# Patient Record
Sex: Female | Born: 1985 | Hispanic: Yes | Marital: Married | Smoking: Never smoker
Health system: Southern US, Community
[De-identification: ages and names within clinical notes are randomized; demographics above are authoritative.]

---

## 2011-07-09 ENCOUNTER — Encounter: Payer: Self-pay | Admitting: Physician Assistant

## 2011-08-05 ENCOUNTER — Ambulatory Visit (INDEPENDENT_AMBULATORY_CARE_PROVIDER_SITE_OTHER): Payer: Managed Care, Other (non HMO)

## 2011-08-05 DIAGNOSIS — Z23 Encounter for immunization: Secondary | ICD-10-CM

## 2011-08-05 DIAGNOSIS — Z3201 Encounter for pregnancy test, result positive: Secondary | ICD-10-CM

## 2011-08-18 ENCOUNTER — Ambulatory Visit (INDEPENDENT_AMBULATORY_CARE_PROVIDER_SITE_OTHER): Payer: Managed Care, Other (non HMO)

## 2011-08-18 ENCOUNTER — Other Ambulatory Visit: Payer: Self-pay | Admitting: Family Medicine

## 2011-08-18 DIAGNOSIS — Z3201 Encounter for pregnancy test, result positive: Secondary | ICD-10-CM

## 2011-08-18 DIAGNOSIS — N390 Urinary tract infection, site not specified: Secondary | ICD-10-CM

## 2011-08-21 LAB — URINE CULTURE: Colony Count: >=100000

## 2011-09-02 LAB — OB RESULTS CONSOLE RUBELLA ANTIBODY, IGM: Rubella: IMMUNE

## 2011-09-02 LAB — OB RESULTS CONSOLE RPR: RPR: NONREACTIVE

## 2011-09-02 LAB — OB RESULTS CONSOLE HIV ANTIBODY (ROUTINE TESTING): HIV: NONREACTIVE

## 2011-09-02 LAB — OB RESULTS CONSOLE GC/CHLAMYDIA: Gonorrhea: NEGATIVE

## 2011-09-02 LAB — OB RESULTS CONSOLE ABO/RH: RH Type: POSITIVE

## 2011-10-01 ENCOUNTER — Ambulatory Visit (INDEPENDENT_AMBULATORY_CARE_PROVIDER_SITE_OTHER): Payer: Managed Care, Other (non HMO) | Admitting: Physician Assistant

## 2011-10-01 ENCOUNTER — Encounter: Payer: Self-pay | Admitting: Physician Assistant

## 2011-10-01 VITALS — BP 103/71 | HR 102 | Temp 99.2°F | Resp 16 | Ht 65.0 in | Wt 127.8 lb

## 2011-10-01 DIAGNOSIS — Z0000 Encounter for general adult medical examination without abnormal findings: Secondary | ICD-10-CM

## 2011-10-01 DIAGNOSIS — R112 Nausea with vomiting, unspecified: Secondary | ICD-10-CM

## 2011-10-01 MED ORDER — ONDANSETRON 4 MG PO TBDP: 4.0000 mg | ORAL_TABLET | Freq: Three times a day (TID) | ORAL | Status: DC | PRN

## 2011-10-01 NOTE — Progress Notes (Signed)
  Subjective:    Patient ID: Julia David, female    DOB: October 19, 1985, 26 y.o.   MRN: [id number]  HPI Presents for Wellness exam.  PMH: Fibrocystic breasts, PCOS.  Pregnant, 13 weeks.  Experiencing significant N/V, early satiety, bloated, constipation, fatigue.  Zofran without benefit.  Seeing Dr. Renaldo Fiddler at [facility], next appointment 10/08/2011.   Review of Systems As above, otherwise negative.    Objective:   Physical Exam  Vital signs noted. Well-developed, well nourished [place] female, accompanied by her husband, who is awake, alert and oriented, in NAD. HEENT: [place]/AT, PERRL, EOMI.  Sclera and conjunctiva are clear.  EAC are patent, TMs are normal in appearance. Nasal mucosa is pink and moist. OP is clear. Neck: supple, non-tender, no lymphadenopathey, thyromegaly. Heart: RRR, no murmur Lungs: CTA Abdomen: normo-active bowel sounds, supple, non-tender, no mass or organomegaly. Extremities: no cyanosis, clubbing or edema. Skin: warm and dry without rash.  Labs deferred to OB.  Patient was unable to provide a urine specimen.     Assessment & Plan:  Wellness Exam. Age appropriate health guidance provided.  N/V secondary to pregnancy. Increase Zofran to 8 mg.  Increase PO fluids with popcicles, crushed ice, anything.  If still no improvement, contact OB for additional instructions.

## 2011-10-01 NOTE — Patient Instructions (Signed)
Please get as much liquid as you can.  Popsicles are a great way to get fluids when you have nausea.  Increase the Zofran (ondansetron) to 2 tablets every 8 hours as needed.  If the increase in dose doesn't help, contact your OB, as you may need alternative treatment. You may use over-the-counter simethicone for gas.  Be sure to take a stool softener, too, as both pregnancy and Zofran can cause constipation, which can make you even more uncomfortable.

## 2012-03-03 LAB — OB RESULTS CONSOLE GBS: GBS: NEGATIVE

## 2012-03-24 ENCOUNTER — Encounter (HOSPITAL_COMMUNITY): Payer: Self-pay | Admitting: *Deleted

## 2012-03-24 ENCOUNTER — Inpatient Hospital Stay (HOSPITAL_COMMUNITY)
Admission: AD | Admit: 2012-03-24 | Discharge: 2012-03-27 | DRG: 775 | Disposition: A | Discharge status: Home / Self Care | Payer: Managed Care, Other (non HMO) | Source: Ambulatory Visit | Attending: Obstetrics and Gynecology | Admitting: Obstetrics and Gynecology

## 2012-03-24 DIAGNOSIS — O878 Other venous complications in the puerperium: Secondary | ICD-10-CM | POA: Diagnosis present

## 2012-03-24 DIAGNOSIS — K649 Unspecified hemorrhoids: Secondary | ICD-10-CM | POA: Diagnosis present

## 2012-03-24 LAB — CBC
Hemoglobin: 13.1 g/dL (ref 12.0–15.0)
MCH: 31.4 pg (ref 26.0–34.0)
MCHC: 32.9 g/dL (ref 30.0–36.0)
MCV: 95.4 fL (ref 78.0–100.0)
Platelets: 231 10*3/uL (ref 150–400)
RBC: 4.17 MIL/uL (ref 3.87–5.11)

## 2012-03-24 LAB — ABO/RH: ABO/RH(D): O POS

## 2012-03-24 MED ORDER — ACETAMINOPHEN 325 MG PO TABS: 650.0000 mg | ORAL_TABLET | ORAL | Status: DC | PRN

## 2012-03-24 MED ORDER — LACTATED RINGERS IV SOLN: 500.0000 mL | INTRAVENOUS | Status: DC | PRN

## 2012-03-24 MED ORDER — SODIUM CHLORIDE 0.9 % IV SOLN
2.0000 g | Freq: Once | INTRAVENOUS | Status: DC
  Filled 2012-03-24: qty 2000

## 2012-03-24 MED ORDER — CITRIC ACID-SODIUM CITRATE 334-500 MG/5ML PO SOLN: 30.0000 mL | ORAL | Status: DC | PRN

## 2012-03-24 MED ORDER — OXYTOCIN 40 UNITS IN LACTATED RINGERS INFUSION - SIMPLE MED
62.5000 mL/h | Freq: Once | INTRAVENOUS | Status: AC
  Administered 2012-03-25: 62.5 mL/h via INTRAVENOUS
  Filled 2012-03-24: qty 1000

## 2012-03-24 MED ORDER — OXYTOCIN 40 UNITS IN LACTATED RINGERS INFUSION - SIMPLE MED
1.0000 m[IU]/min | INTRAVENOUS | Status: DC
  Administered 2012-03-24: 2 m[IU]/min via INTRAVENOUS

## 2012-03-24 MED ORDER — IBUPROFEN 600 MG PO TABS: 600.0000 mg | ORAL_TABLET | Freq: Four times a day (QID) | ORAL | Status: DC | PRN

## 2012-03-24 MED ORDER — LIDOCAINE HCL (PF) 1 % IJ SOLN
30.0000 mL | INTRAMUSCULAR | Status: DC | PRN
  Administered 2012-03-25: 30 mL via SUBCUTANEOUS
  Filled 2012-03-24: qty 30

## 2012-03-24 MED ORDER — BUTORPHANOL TARTRATE 1 MG/ML IJ SOLN: 1.0000 mg | INTRAMUSCULAR | Status: DC | PRN

## 2012-03-24 MED ORDER — OXYCODONE-ACETAMINOPHEN 5-325 MG PO TABS: 1.0000 | ORAL_TABLET | ORAL | Status: DC | PRN

## 2012-03-24 MED ORDER — OXYTOCIN BOLUS FROM INFUSION
250.0000 mL | Freq: Once | INTRAVENOUS | Status: AC
  Administered 2012-03-25: 250 mL via INTRAVENOUS
  Filled 2012-03-24: qty 500

## 2012-03-24 MED ORDER — FLEET ENEMA 7-19 GM/118ML RE ENEM: 1.0000 | ENEMA | RECTAL | Status: DC | PRN

## 2012-03-24 MED ORDER — TERBUTALINE SULFATE 1 MG/ML IJ SOLN: 0.2500 mg | Freq: Once | INTRAMUSCULAR | Status: AC | PRN

## 2012-03-24 MED ORDER — ONDANSETRON HCL 4 MG/2ML IJ SOLN: 4.0000 mg | Freq: Four times a day (QID) | INTRAMUSCULAR | Status: DC | PRN

## 2012-03-24 MED ORDER — OXYTOCIN 40 UNITS IN LACTATED RINGERS INFUSION - SIMPLE MED: 1.0000 m[IU]/min | INTRAVENOUS | Status: DC

## 2012-03-24 MED ORDER — CLINDAMYCIN PHOSPHATE 900 MG/50ML IV SOLN: 900.0000 mg | Freq: Once | INTRAVENOUS | Status: DC

## 2012-03-24 MED ORDER — LACTATED RINGERS IV SOLN
INTRAVENOUS | Status: DC
  Administered 2012-03-24 – 2012-03-25 (×2): via INTRAVENOUS

## 2012-03-24 NOTE — Plan of Care (Signed)
Problem: Consults Goal: Birthing Suites Patient Information Press F2 to bring up selections list   Pt 37-[redacted] weeks EGA     

## 2012-03-24 NOTE — Progress Notes (Signed)
Cx 5/C/-2 FHT reactive UCs about q 3-5 min

## 2012-03-24 NOTE — Progress Notes (Signed)
Cx 4/C/-2/vtx AROM scant clear fluid    Prob 3 cm cyst at 3:00 position on cx FHT reactive UCs irreg D/W patient/husband will begin pitocin, epidural prn

## 2012-03-24 NOTE — H&P (Signed)
Julia David is a 26 y.o. female presenting for labor.  Patient presented to office today with c/o decreased FM.  At office FM became normal and NST reactive.  UCs about q 3-4 min and cervix changed from 3/50/-2 to 4/90/-2 and vertex. NO ROM/Bleeding, no CNS change or epigastric pain. Maternal Medical History:  Reason for admission: Reason for admission: contractions.  Contractions: Onset was 3-5 hours ago.    Fetal activity: Perceived fetal activity is normal.      OB History    Grav Para Term Preterm Abortions TAB SAB Ect Mult Living   1              No past medical history on file. No past surgical history on file. Family History: family history is not on file. Social History:  reports that she has never smoked. She does not have any smokeless tobacco history on file. Her alcohol and drug histories not on file.   Prenatal Transfer Tool  Maternal Diabetes: No Genetic Screening: Normal Maternal Ultrasounds/Referrals: Normal Fetal Ultrasounds or other Referrals:  None Maternal Substance Abuse:  No Significant Maternal Medications:  None Significant Maternal Lab Results:  None Other Comments:  None  Review of Systems  Eyes: Negative for blurred vision.  Gastrointestinal: Negative for abdominal pain.  Neurological: Negative for headaches.      Blood pressure 120/77, pulse 101, temperature 98.5 F (36.9 C), temperature source Oral, height 5\' 3"  (1.6 m), weight 71.215 kg (157 lb), last menstrual period 06/29/2011. Maternal Exam:  Uterine Assessment: Contraction strength is moderate.  Contraction frequency is regular.   Abdomen: Patient reports no abdominal tenderness. Fetal presentation: vertex     Fetal Exam Fetal Monitor Review: Pattern: accelerations present.       Physical Exam  Cardiovascular: Normal rate and regular rhythm.   Respiratory: Effort normal and breath sounds normal.  GI: Soft. Bowel sounds are normal.  Neurological: She has normal reflexes.    Prenatal labs: ABO, Rh:   Antibody:   Rubella:   RPR:    HBsAg:    HIV:    GBS:     Assessment/Plan: 26 yo G1P0 at 57 4/7 weeks entering active labor. Admit   Julia David II,Julia David E 03/24/2012, 4:21 PM

## 2012-03-24 NOTE — Progress Notes (Signed)
Cx 4-5/C/-2 Cyst on cx unchanged FHT reactive UCs about q 3-4 min.

## 2012-03-25 ENCOUNTER — Inpatient Hospital Stay (HOSPITAL_COMMUNITY): Payer: Managed Care, Other (non HMO) | Admitting: Anesthesiology

## 2012-03-25 ENCOUNTER — Encounter (HOSPITAL_COMMUNITY): Payer: Self-pay | Admitting: Anesthesiology

## 2012-03-25 ENCOUNTER — Encounter (HOSPITAL_COMMUNITY): Payer: Self-pay | Admitting: *Deleted

## 2012-03-25 MED ORDER — TETANUS-DIPHTH-ACELL PERTUSSIS 5-2.5-18.5 LF-MCG/0.5 IM SUSP
0.5000 mL | Freq: Once | INTRAMUSCULAR | Status: DC
  Filled 2012-03-25: qty 0.5

## 2012-03-25 MED ORDER — ZOLPIDEM TARTRATE 5 MG PO TABS: 5.0000 mg | ORAL_TABLET | Freq: Every evening | ORAL | Status: DC | PRN

## 2012-03-25 MED ORDER — FENTANYL 2.5 MCG/ML BUPIVACAINE 1/10 % EPIDURAL INFUSION (WH - ANES)
14.0000 mL/h | INTRAMUSCULAR | Status: DC
Start: 2012-03-25 — End: 2012-03-25
  Filled 2012-03-25: qty 60

## 2012-03-25 MED ORDER — SENNOSIDES-DOCUSATE SODIUM 8.6-50 MG PO TABS: 2.0000 | ORAL_TABLET | Freq: Every day | ORAL | Status: DC

## 2012-03-25 MED ORDER — FLEET ENEMA 7-19 GM/118ML RE ENEM: 1.0000 | ENEMA | Freq: Every day | RECTAL | Status: DC | PRN

## 2012-03-25 MED ORDER — WITCH HAZEL-GLYCERIN EX PADS
1.0000 "application " | MEDICATED_PAD | CUTANEOUS | Status: DC | PRN
  Administered 2012-03-26 – 2012-03-27 (×2): 1 via TOPICAL

## 2012-03-25 MED ORDER — ONDANSETRON HCL 4 MG/2ML IJ SOLN: 4.0000 mg | INTRAMUSCULAR | Status: DC | PRN

## 2012-03-25 MED ORDER — EPHEDRINE 5 MG/ML INJ
10.0000 mg | INTRAVENOUS | Status: DC | PRN
  Filled 2012-03-25: qty 4

## 2012-03-25 MED ORDER — LACTATED RINGERS IV SOLN
500.0000 mL | Freq: Once | INTRAVENOUS | Status: AC
  Administered 2012-03-25: 500 mL via INTRAVENOUS

## 2012-03-25 MED ORDER — EPHEDRINE 5 MG/ML INJ: 10.0000 mg | INTRAVENOUS | Status: DC | PRN

## 2012-03-25 MED ORDER — DIPHENHYDRAMINE HCL 25 MG PO CAPS: 25.0000 mg | ORAL_CAPSULE | Freq: Four times a day (QID) | ORAL | Status: DC | PRN

## 2012-03-25 MED ORDER — ONDANSETRON HCL 4 MG PO TABS: 4.0000 mg | ORAL_TABLET | ORAL | Status: DC | PRN

## 2012-03-25 MED ORDER — SODIUM BICARBONATE 8.4 % IV SOLN
INTRAVENOUS | Status: DC | PRN
  Administered 2012-03-25: 44 mL via EPIDURAL

## 2012-03-25 MED ORDER — OXYCODONE-ACETAMINOPHEN 5-325 MG PO TABS: 1.0000 | ORAL_TABLET | ORAL | Status: DC | PRN

## 2012-03-25 MED ORDER — LANOLIN HYDROUS EX OINT: TOPICAL_OINTMENT | CUTANEOUS | Status: DC | PRN

## 2012-03-25 MED ORDER — BISACODYL 10 MG RE SUPP: 10.0000 mg | Freq: Every day | RECTAL | Status: DC | PRN

## 2012-03-25 MED ORDER — BENZOCAINE-MENTHOL 20-0.5 % EX AERO
1.0000 "application " | INHALATION_SPRAY | CUTANEOUS | Status: DC | PRN
  Administered 2012-03-26: 1 via TOPICAL
  Filled 2012-03-25 (×2): qty 56

## 2012-03-25 MED ORDER — PHENYLEPHRINE 40 MCG/ML (10ML) SYRINGE FOR IV PUSH (FOR BLOOD PRESSURE SUPPORT): 80.0000 ug | PREFILLED_SYRINGE | INTRAVENOUS | Status: DC | PRN

## 2012-03-25 MED ORDER — IBUPROFEN 600 MG PO TABS
600.0000 mg | ORAL_TABLET | Freq: Four times a day (QID) | ORAL | Status: DC
  Administered 2012-03-25 – 2012-03-27 (×7): 600 mg via ORAL
  Filled 2012-03-25 (×8): qty 1

## 2012-03-25 MED ORDER — PRENATAL MULTIVITAMIN CH
1.0000 | ORAL_TABLET | Freq: Every day | ORAL | Status: DC
  Administered 2012-03-26 – 2012-03-27 (×2): 1 via ORAL
  Filled 2012-03-25 (×2): qty 1

## 2012-03-25 MED ORDER — PHENYLEPHRINE 40 MCG/ML (10ML) SYRINGE FOR IV PUSH (FOR BLOOD PRESSURE SUPPORT)
80.0000 ug | PREFILLED_SYRINGE | INTRAVENOUS | Status: DC | PRN
  Filled 2012-03-25: qty 5

## 2012-03-25 MED ORDER — DIBUCAINE 1 % RE OINT
1.0000 "application " | TOPICAL_OINTMENT | RECTAL | Status: DC | PRN
  Administered 2012-03-27: 1 via RECTAL
  Filled 2012-03-25: qty 28

## 2012-03-25 MED ORDER — DIPHENHYDRAMINE HCL 50 MG/ML IJ SOLN: 12.5000 mg | INTRAMUSCULAR | Status: DC | PRN

## 2012-03-25 MED ORDER — SIMETHICONE 80 MG PO CHEW: 80.0000 mg | CHEWABLE_TABLET | ORAL | Status: DC | PRN

## 2012-03-25 MED ORDER — FENTANYL 2.5 MCG/ML BUPIVACAINE 1/10 % EPIDURAL INFUSION (WH - ANES)
INTRAMUSCULAR | Status: DC | PRN
Start: 2012-03-25 — End: 2012-03-26
  Administered 2012-03-25: 14 mL/h via EPIDURAL

## 2012-03-25 NOTE — Progress Notes (Signed)
Delivery Note SVD VMI  Apgars 9/9 Weight/pH pending Placenta 3 vessels , intact EBL  500cc cx intact Second degree ML lac with small lt sulcus extension repaired Rectum checked Patient and infant stable in LDR

## 2012-03-25 NOTE — Anesthesia Procedure Notes (Signed)
Epidural Patient location during procedure: OB  Preanesthetic Checklist Completed: patient identified, site marked, surgical consent, pre-op evaluation, timeout performed, IV checked, risks and benefits discussed and monitors and equipment checked  Epidural Patient position: sitting Prep: site prepped and draped and DuraPrep Patient monitoring: continuous pulse ox and blood pressure Approach: midline Injection technique: LOR air  Needle:  Needle type: Tuohy  Needle gauge: 17 G Needle length: 9 cm and 9 Needle insertion depth: 4 cm Catheter type: closed end flexible Catheter size: 19 Gauge Catheter at skin depth: 10 cm Test dose: negative  Assessment Events: blood not aspirated, injection not painful, no injection resistance, negative IV test and no paresthesia  Additional Notes Dosing of Epidural:  1st dose, through needle ............................................. epi 1:200K + Xylocaine 40 mg  2nd dose, through catheter, after waiting 3 minutes.....epi 1:200K + Xylocaine 40 mg  3rd dose, through catheter after waiting 3 minutes .............................Marcaine   4mg   ( mg Marcaine are expressed as equivilent  cc's medication removed from the 0.1%Bupiv / fentanyl syringe from L&D pump)  ( 2% Xylo charted as a single dose in Epic Meds for ease of charting; actual dosing was fractionated as above, for saftey's sake)  As each dose occurred, patient was free of IV sx; and patient exhibited no evidence of SA injection.  Patient is more comfortable after epidural dosed. Please see RN's note for documentation of vital signs,and FHR which are stable.  Patient reminded not to try to ambulate with numb legs, and that an RN must be present the 1st time she attempts to get up.    

## 2012-03-25 NOTE — Anesthesia Preprocedure Evaluation (Signed)

## 2012-03-25 NOTE — Progress Notes (Signed)
Approximately 30 mins ago cx 5/C/-2 AROM of forebag small amount of clear fluid FHT reactive Will place epidural

## 2012-03-25 NOTE — Progress Notes (Signed)
Cx rim/C/0 FHT variable decels with some UCs, accels present UCs q 2--3 min

## 2012-03-26 LAB — CBC
MCH: 32.3 pg (ref 26.0–34.0)
MCHC: 33.5 g/dL (ref 30.0–36.0)
Platelets: 174 10*3/uL (ref 150–400)

## 2012-03-26 LAB — RPR: RPR Ser Ql: NONREACTIVE

## 2012-03-26 NOTE — Anesthesia Postprocedure Evaluation (Signed)
  Anesthesia Post-op Note  Patient: Julia David  Procedure(s) Performed: * No procedures listed *  Patient Location: Mother/Baby  Anesthesia Type: Epidural  Level of Consciousness: awake  Airway and Oxygen Therapy: Patient Spontanous Breathing  Post-op Pain: mild  Post-op Assessment: Patient's Cardiovascular Status Stable and Respiratory Function Stable  Post-op Vital Signs: stable  Complications: No apparent anesthesia complications

## 2012-03-26 NOTE — Progress Notes (Signed)
Post Partum Day 1 Subjective: no complaints, up ad lib and voiding  Objective: Blood pressure 97/65, pulse 87, temperature 98.2 F (36.8 C), temperature source Oral, resp. rate 18, height 5\' 3"  (1.6 m), weight 71.215 kg (157 lb), last menstrual period 06/29/2011, SpO2 98.00%, unknown if currently breastfeeding.  Physical Exam:  General: alert and cooperative Lochia: appropriate Uterine Fundus: firm Incision: perineum intact DVT Evaluation: No evidence of DVT seen on physical exam.   Basename 03/26/12 0520 03/24/12 1400  HGB 11.1* 13.1  HCT 33.1* 39.8    Assessment/Plan: Plan for discharge tomorrow   LOS: 2 days   Junior Kenedy G 03/26/2012, 8:34 AM

## 2012-03-27 MED ORDER — OXYCODONE-ACETAMINOPHEN 5-325 MG PO TABS: 1.0000 | ORAL_TABLET | ORAL | Status: AC | PRN

## 2012-03-27 MED ORDER — IBUPROFEN 600 MG PO TABS: 600.0000 mg | ORAL_TABLET | Freq: Four times a day (QID) | ORAL | Status: AC

## 2012-03-27 NOTE — Discharge Summary (Signed)
Obstetric Discharge Summary Reason for Admission: onset of labor Prenatal Procedures: ultrasound Intrapartum Procedures: spontaneous vaginal delivery Postpartum Procedures: none Complications-Operative and Postpartum: 2 degree perineal laceration Hemoglobin  Date Value Range Status  03/26/2012 11.1* 12.0 - 15.0 g/dL Final     HCT  Date Value Range Status  03/26/2012 33.1* 36.0 - 46.0 % Final    Physical Exam:  General: alert and cooperative Lochia: appropriate Uterine Fundus: firm Incision: perineum intact, small hemorrhoid DVT Evaluation: No evidence of DVT seen on physical exam.  Discharge Diagnoses: Term Pregnancy-delivered  Discharge Information: Date: 03/27/2012 Activity: pelvic rest Diet: routine Medications: PNV, Ibuprofen and Percocet Condition: stable Instructions: refer to practice specific booklet Discharge to: home   Newborn Data: Live born female  Birth Weight: 7 lb 6.9 oz (3370 g) APGAR: 9, 9  Home with mother.  CURTIS,CAROL G 03/27/2012, 7:59 AM

## 2012-03-31 ENCOUNTER — Ambulatory Visit (HOSPITAL_COMMUNITY)
Admission: RE | Admit: 2012-03-31 | Discharge: 2012-03-31 | Disposition: A | Discharge status: Home / Self Care | Payer: Managed Care, Other (non HMO) | Source: Ambulatory Visit | Attending: Obstetrics and Gynecology | Admitting: Obstetrics and Gynecology

## 2012-03-31 DIAGNOSIS — O923 Agalactia: Secondary | ICD-10-CM | POA: Insufficient documentation

## 2012-03-31 NOTE — Progress Notes (Signed)
Adult Lactation Consultation Outpatient Visit Note  Patient Name: Julia David  Baby: Joselyn Glassman Ruckwardt Date of Birth: 01-21-1986   DOB: [birth year]  BW: 7# 6.9 oz Gestational Age at Delivery: [redacted]w[redacted]d  D/c weight: 7# 1.9oz (03/27/12) Type of Delivery:     Weight at the peds office: 7# 0oz (03/28/12)       Weight today: 6# 15.5 oz (03/31/12)   Breastfeeding History: Frequency of Breastfeeding: Not feeding at breast.   Voids: 2 BMs/24 hours Stools: 6-8/24 hours  Supplementing / Method: Pumping:  Type of Pump: Lansinoh    Frequency: q3h (except at night) for 30 min  Volume:  2 oz   Comments: Parents are only giving 80mL/feeding q2-3 h.  Parents had only been giving 15 mL, until told to increase by pediatrician.  Parents give formula at night.      Consultation Evaluation:  Initial Feeding Assessment: Pre-feed Weight: 3160g (6# 15.5oz) Post-feed Weight: 3182g Amount Transferred: 22mL Comments: L breast, football hold Initially, latch was attempted without the nipple shield, but a good latch was not achieved.  Nipple shield was then applied w/good results.    Additional Feeding Assessment: Pre-feed Weight: 3182g Post-feed Weight: 3202g Amount Transferred: 20 mL Comments: R breast, football hold; with nipple shield  Additional Feeding Assessment: Pre-feed Weight: Post-feed Weight: Amount Transferred: Comments:  Total Breast milk Transferred this Visit: 42mL (entire feeding was about 30-40 min) Total Supplement Given: 0  Additional Interventions:   Follow-Up  Mom received a nipple shield while in the hospital (sizes 20 & 24). Mom had not used the nipple shield at home; Mom says they hurt. Initially, latch was attempted without the nipple shield, but a good latch was not achieved.  Nipple shield was then applied w/good results.  Parents were pleased with how well baby fed.  Parents made aware that when they bottle feed, they need to increase amount that baby is eating (up to  45-50ccs today, then a little more tomorrow, etc). Mom also made aware that when she pumps, she may not need to pump for as long as 30 minutes.   Mom did feel some slight burning of nipple when baby was feeding from R breast.  Nipple looked slightly pinched within the nipple shield.  Parents were taught about having baby's mouth wide when breastfeeding & bottle feeding.  Dad also taught how to lower baby's mandible to get a wider gape. Comfort Gels provided w/instructions for use. Pacifier use discouraged at this time, earlier feeding cues reviewed.     Will return to Peds tomorrow, 04/01/12.    Lurline Hare [facility] 03/31/2012, 1:08 PM

## 2012-04-12 ENCOUNTER — Ambulatory Visit (INDEPENDENT_AMBULATORY_CARE_PROVIDER_SITE_OTHER): Payer: Managed Care, Other (non HMO) | Admitting: Family Medicine

## 2012-04-12 VITALS — BP 112/74 | HR 91 | Temp 98.7°F | Resp 18 | Ht 64.75 in | Wt 145.0 lb

## 2012-04-12 DIAGNOSIS — L509 Urticaria, unspecified: Secondary | ICD-10-CM

## 2012-04-12 DIAGNOSIS — Z23 Encounter for immunization: Secondary | ICD-10-CM

## 2012-04-12 MED ORDER — PREDNISONE 20 MG PO TABS: ORAL_TABLET | ORAL | Status: DC

## 2012-04-12 NOTE — Progress Notes (Signed)
[facility] [address] [place] [address] [phone]  Date:  04/12/2012   Name:  Julia David   DOB:  1985-09-02   MRN:  [id number]  PCP:  No primary provider on file.    Chief Complaint: rash on arms and legs   History of Present Illness:  Julia David is a 26 y.o. very pleasant female patient who presents with the following:  Just gave birth 3 weeks ago!  She has a son- he is healthy and doing well, but is not sleeping well during the night.  She and her husband are tired but happy.   A couple of days ago she developed an itchy rash on her thighs, which spread to her arms.  The rash has seemed to come and go.  She has not noted any mouth or lip swelling, and no SOB.    She did try some soy milk several days ago- this was a new food for her.  She is nursing, but has stopped temporarily because she is afraid that she might be contagious.  She is using a pump to maintain her mild supply.   She has never had hives in the past.  Otherwise she is feeling ok.      There is no problem list on file for this patient.   No past medical history on file.  No past surgical history on file.  History  Substance Use Topics  . Smoking status: Never Smoker   . Smokeless tobacco: Not on file  . Alcohol Use: Not on file    No family history on file.  No Known Allergies  Medication list has been reviewed and updated.  Current Outpatient Prescriptions on File Prior to Visit  Medication Sig Dispense Refill  . Prenatal Vit-Fe Fumarate-FA (PRENATAL MULTIVITAMIN) TABS Take 1 tablet by mouth daily.        Review of Systems:  As per HPI- otherwise negative.   Physical Examination: Filed Vitals:   04/12/12 1408  BP: 112/74  Pulse: 91  Temp: 98.7 F (37.1 C)  Resp: 18   Filed Vitals:   04/12/12 1408  Height: 5' 4.75" (1.645 m)  Weight: 145 lb (65.772 kg)   Body mass index is 24.32 kg/(m^2). Ideal Body Weight: Weight in (lb) to have BMI = 25:  148.8   GEN: WDWN, NAD, Non-toxic, A & O x 3 HEENT: Atraumatic, Normocephalic. Neck supple. No masses, No LAD.  No oral lesions, no angioedema.  PEERL.   Ears and Nose: No external deformity. CV: RRR, No M/G/R. No JVD. No thrill. No extra heart sounds. PULM: CTA B, no wheezes, crackles, rhonchi. No retractions. No resp. distress. No accessory muscle use. ABD: S, NT, ND EXTR: No c/c/e NEURO Normal gait.  PSYCH: Normally interactive. Conversant. Not depressed or anxious appearing.  Calm demeanor.  Skin; there are diffuse urticaria, worse over the arms and thighs.  No lip or tongue swelling.  Palms and soles are spared.  No vesicles or other rash.    Assessment and Plan: 1. Urticaria  predniSONE (DELTASONE) 20 MG tablet  2. Immunization due  Flu vaccine greater than or equal to 3yo preservative free IM   Mc has an urticarial rash, which could be due to the soy milk that she tried, or to stress or another trigger.  She will try OTC antihistamines such as claritin or benadryl.  If this does not help, I gave her an rx for prednisone to try.  All of these medications are acceptable for lactation, but she understands the desire to use as little medication as possible.  If she develops any more concerning symptoms such as swelling of his face/ lips/ tongue or SOB she will seek care right away. Otherwise, she will let me know if her rash is not better in a few days, sooner if worse.  Flu shot today.  Reassured that she is not contagious and that she may continue to breast- feed her son  Meds ordered this encounter  Medications  . predniSONE (DELTASONE) 20 MG tablet    Sig: Take 2 pills for 2 days, then 1 pill for 3 days    Dispense:  7 tablet    Refill:  0       COPLAND,JESSICA, MD

## 2014-08-04 ENCOUNTER — Emergency Department (HOSPITAL_COMMUNITY): Payer: BLUE CROSS/BLUE SHIELD

## 2014-08-04 ENCOUNTER — Emergency Department (HOSPITAL_COMMUNITY)
Admission: EM | Admit: 2014-08-04 | Discharge: 2014-08-04 | Disposition: A | Discharge status: Home / Self Care | Payer: BLUE CROSS/BLUE SHIELD | Attending: Emergency Medicine | Admitting: Emergency Medicine

## 2014-08-04 ENCOUNTER — Encounter (HOSPITAL_COMMUNITY): Payer: Self-pay | Admitting: Emergency Medicine

## 2014-08-04 DIAGNOSIS — R079 Chest pain, unspecified: Secondary | ICD-10-CM

## 2014-08-04 DIAGNOSIS — Z7952 Long term (current) use of systemic steroids: Secondary | ICD-10-CM | POA: Diagnosis not present

## 2014-08-04 DIAGNOSIS — M545 Low back pain: Secondary | ICD-10-CM | POA: Insufficient documentation

## 2014-08-04 DIAGNOSIS — Z79899 Other long term (current) drug therapy: Secondary | ICD-10-CM | POA: Diagnosis not present

## 2014-08-04 DIAGNOSIS — R1013 Epigastric pain: Secondary | ICD-10-CM | POA: Diagnosis not present

## 2014-08-04 DIAGNOSIS — R0789 Other chest pain: Secondary | ICD-10-CM | POA: Diagnosis not present

## 2014-08-04 LAB — I-STAT TROPONIN, ED: TROPONIN I, POC: 0 ng/mL (ref 0.00–0.08)

## 2014-08-04 LAB — CBC
HCT: 40 % (ref 36.0–46.0)
Hemoglobin: 13.6 g/dL (ref 12.0–15.0)
MCH: 30.8 pg (ref 26.0–34.0)
MCHC: 34 g/dL (ref 30.0–36.0)
MCV: 90.7 fL (ref 78.0–100.0)
PLATELETS: 346 10*3/uL (ref 150–400)
RBC: 4.41 MIL/uL (ref 3.87–5.11)
RDW: 12.2 % (ref 11.5–15.5)
WBC: 11.1 10*3/uL — ABNORMAL HIGH (ref 4.0–10.5)

## 2014-08-04 LAB — BASIC METABOLIC PANEL
Anion gap: 10 (ref 5–15)
BUN: 10 mg/dL (ref 6–23)
CHLORIDE: 104 meq/L (ref 96–112)
CO2: 26 mmol/L (ref 19–32)
Calcium: 9.3 mg/dL (ref 8.4–10.5)
Creatinine, Ser: 0.67 mg/dL (ref 0.50–1.10)
GFR calc Af Amer: >90 mL/min (ref >90)
GFR calc non Af Amer: >90 mL/min (ref >90)
GLUCOSE: 118 mg/dL — AB (ref 70–99)
POTASSIUM: 3.3 mmol/L — AB (ref 3.5–5.1)
SODIUM: 140 mmol/L (ref 135–145)

## 2014-08-04 MED ORDER — FAMOTIDINE 20 MG PO TABS: 20.0000 mg | ORAL_TABLET | Freq: Two times a day (BID) | ORAL | Status: AC

## 2014-08-04 MED ORDER — SUCRALFATE 1 G PO TABS: 1.0000 g | ORAL_TABLET | Freq: Four times a day (QID) | ORAL | Status: DC

## 2014-08-04 MED ORDER — SIMETHICONE 80 MG PO CHEW: 80.0000 mg | CHEWABLE_TABLET | Freq: Four times a day (QID) | ORAL | Status: AC | PRN

## 2014-08-04 NOTE — ED Notes (Signed)
Pt. reports central chest pain with mild nausea onset this evening , denies SOB or diaphoresis . No cough or congestion .

## 2014-08-04 NOTE — ED Provider Notes (Signed)
CSN: [id number]     Arrival date & time 08/04/14  [id number] History  This chart was scribed for Olivia Mackielga M Lailah Marcelli, MD by Bronson CurbJacqueline Melvin, ED Scribe. This patient was seen in room B18C/B18C and the patient's care was started at 3:50 AM.   Chief Complaint  Patient presents with  . Chest Pain    The history is provided by the patient. No language interpreter was used.     HPI Comments: Julia David is a 29 y.o. female, with no significant medical history, who presents to the Emergency Department complaining of sudden onset central chest pain that began PTA. Patient states she was lying down when the pain began. She notes the pain radiates from the left side and to the back. She describes the pain as pressure that is worse when lying down. She also notes having flatulence with the pain. Patient denies any pain at this time. She denies history of gallbladder problems. She notes family history of DM. Patient also notes burning, pressure, and upper abdominal pain for the past 2 weeks. She notes associated belching and flatus with the pain and notes it only occurs at night. She denies any recent changes in her diet or high caffeine consumption.  Patient tried Prilosec for 2 weeks without improvement in symptoms.  Symptoms have resolved since being in the emergency department.  She is currently asymptomatic.   History reviewed. No pertinent past medical history. History reviewed. No pertinent past surgical history. No family history on file. History  Substance Use Topics  . Smoking status: Never Smoker   . Smokeless tobacco: Not on file  . Alcohol Use: No   OB History    Gravida Para Term Preterm AB TAB SAB Ectopic Multiple Living   1 1 1       1      Review of Systems  Constitutional: Negative for diaphoresis.  Respiratory: Negative for shortness of breath.   Cardiovascular: Positive for chest pain.  Gastrointestinal: Positive for abdominal pain.  Musculoskeletal: Positive for back pain.       Allergies  Review of patient's allergies indicates no known allergies.  Home Medications   Prior to Admission medications   Medication Sig Start Date End Date Taking? Authorizing Provider  predniSONE (DELTASONE) 20 MG tablet Take 2 pills for 2 days, then 1 pill for 3 days 04/12/12   Pearline CablesJessica C Copland, MD  Prenatal Vit-Fe Fumarate-FA (PRENATAL MULTIVITAMIN) TABS Take 1 tablet by mouth daily.    Historical Provider, MD   Triage Vitals: BP 113/73 mmHg  Pulse 84  Temp(Src) 98.6 F (37 C) (Oral)  Resp 18  Ht 5\' 6"  (1.676 m)  Wt 141 lb (63.957 kg)  BMI 22.77 kg/m2  SpO2 100%  LMP 08/03/2014  Physical Exam  Constitutional: She is oriented to person, place, and time. She appears well-developed and well-nourished. No distress.  HENT:  Head: Normocephalic and atraumatic.  Nose: Nose normal.  Mouth/Throat: Oropharynx is clear and moist.  Eyes: Conjunctivae and EOM are normal. Pupils are equal, round, and reactive to light.  Neck: Normal range of motion. Neck supple. No JVD present. No tracheal deviation present. No thyromegaly present.  Cardiovascular: Normal rate, regular rhythm, normal heart sounds and intact distal pulses.  Exam reveals no gallop and no friction rub.   No murmur heard. Pulmonary/Chest: Effort normal and breath sounds normal. No stridor. No respiratory distress. She has no wheezes. She has no rales. She exhibits no tenderness.  Abdominal: Soft. Bowel sounds are normal.  She exhibits no distension and no mass. There is no tenderness. There is no rebound and no guarding.  Musculoskeletal: Normal range of motion. She exhibits no edema or tenderness.  Lymphadenopathy:    She has no cervical adenopathy.  Neurological: She is alert and oriented to person, place, and time. She displays normal reflexes. She exhibits normal muscle tone. Coordination normal.  Skin: Skin is warm and dry. No rash noted. No erythema. No pallor.  Psychiatric: She has a normal mood and affect.  Her behavior is normal. Judgment and thought content normal.  Nursing note and vitals reviewed.   ED Course  Procedures (including critical care time)  DIAGNOSTIC STUDIES: Oxygen Saturation is 100% on room air, normal by my interpretation.    COORDINATION OF CARE: At 6 Discussed treatment plan with patient. Patient agrees.   Labs Review Labs Reviewed  CBC - Abnormal; Notable for the following:    WBC 11.1 (*)    All other components within normal limits  BASIC METABOLIC PANEL - Abnormal; Notable for the following:    Potassium 3.3 (*)    Glucose, Bld 118 (*)    All other components within normal limits  I-STAT TROPOININ, ED    Imaging Review Dg Chest 2 View  08/04/2014   CLINICAL DATA:  Acute onset of mid and left-sided chest pain. Initial encounter.  EXAM: CHEST  2 VIEW  COMPARISON:  None.  FINDINGS: The lungs are well-aerated and clear. There is no evidence of focal opacification, pleural effusion or pneumothorax.  The heart is normal in size; the mediastinal contour is within normal limits. No acute osseous abnormalities are seen.  IMPRESSION: No acute cardiopulmonary process seen.   Electronically Signed   By: Roanna Raider M.D.   On: 08/04/2014 02:09     EKG Interpretation   Date/Time:  Thursday August 04 2014 00:48:53 EST Ventricular Rate:  80 PR Interval:  140 QRS Duration: 70 QT Interval:  378 QTC Calculation: 435 R Axis:   53 Text Interpretation:  Normal sinus rhythm Normal ECG Confirmed by Harshitha Fretz   MD, Kawon Willcutt ([id number]) on 08/04/2014 3:48:11 AM      Results for orders placed or performed during the hospital encounter of 08/04/14  CBC  Result Value Ref Range   WBC 11.1 (H) 4.0 - 10.5 K/uL   RBC 4.41 3.87 - 5.11 MIL/uL   Hemoglobin 13.6 12.0 - 15.0 g/dL   HCT 52.8 41.3 - 24.4 %   MCV 90.7 78.0 - 100.0 fL   MCH 30.8 26.0 - 34.0 pg   MCHC 34.0 30.0 - 36.0 g/dL   RDW 01.0 27.2 - 53.6 %   Platelets 346 150 - 400 K/uL  Basic metabolic panel  Result Value Ref  Range   Sodium 140 135 - 145 mmol/L   Potassium 3.3 (L) 3.5 - 5.1 mmol/L   Chloride 104 96 - 112 mEq/L   CO2 26 19 - 32 mmol/L   Glucose, Bld 118 (H) 70 - 99 mg/dL   BUN 10 6 - 23 mg/dL   Creatinine, Ser 6.44 0.50 - 1.10 mg/dL   Calcium 9.3 8.4 - 03.4 mg/dL   GFR calc non Af Amer >90 >90 mL/min   GFR calc Af Amer >90 >90 mL/min   Anion gap 10 5 - 15  I-stat troponin, ED (not at [facility])  Result Value Ref Range   Troponin i, poc 0.00 0.00 - 0.08 ng/mL   Comment 3  Dg Chest 2 View  08/04/2014   CLINICAL DATA:  Acute onset of mid and left-sided chest pain. Initial encounter.  EXAM: CHEST  2 VIEW  COMPARISON:  None.  FINDINGS: The lungs are well-aerated and clear. There is no evidence of focal opacification, pleural effusion or pneumothorax.  The heart is normal in size; the mediastinal contour is within normal limits. No acute osseous abnormalities are seen.  IMPRESSION: No acute cardiopulmonary process seen.   Electronically Signed   By: Roanna Raider M.D.   On: 08/04/2014 02:09    MDM   Final diagnoses:  Chest pain with minimal risk for cardiac etiology  Dyspepsia    29 year old female with central chest pressure, which has since resolved.  Workup here unremarkable.  Symptoms seem more consistent with than cardiac or pulmonary involvement.  Patient to be instructed to take simethicone if she has return of symptoms, will start on Pepcid and Carafate.  She is instructed to follow-up with her primary care doctor.   I personally performed the services described in this documentation, which was scribed in my presence. The recorded information has been reviewed and is accurate.     Olivia Mackie, MD 08/04/14 [phone]

## 2014-08-04 NOTE — Discharge Instructions (Signed)
Chest Pain (Nonspecific) It is often hard to give a diagnosis for the cause of chest pain. There is always a chance that your pain could be related to something serious, such as a heart attack or a blood clot in the lungs. You need to follow up with your doctor. HOME CARE  If antibiotic medicine was given, take it as directed by your doctor. Finish the medicine even if you start to feel better.  For the next few days, avoid activities that bring on chest pain. Continue physical activities as told by your doctor.  Do not use any tobacco products. This includes cigarettes, chewing tobacco, and e-cigarettes.  Avoid drinking alcohol.  Only take medicine as told by your doctor.  Follow your doctor's suggestions for more testing if your chest pain does not go away.  Keep all doctor visits you made. GET HELP IF:  Your chest pain does not go away, even after treatment.  You have a rash with blisters on your chest.  You have a fever. GET HELP RIGHT AWAY IF:   You have more pain or pain that spreads to your arm, neck, jaw, back, or belly (abdomen).  You have shortness of breath.  You cough more than usual or cough up blood.  You have very bad back or belly pain.  You feel sick to your stomach (nauseous) or throw up (vomit).  You have very bad weakness.  You pass out (faint).  You have chills. This is an emergency. Do not wait to see if the problems will go away. Call your local emergency services (911 in [place] Do not drive yourself to the hospital. MAKE SURE YOU:   Understand these instructions.  Will watch your condition.  Will get help right away if you are not doing well or get worse. Document Released: 12/25/2007 Document Revised: 07/13/2013 Document Reviewed: 12/25/2007 [facility] Patient Information 2015 [place], [place]. This information is not intended to replace advice given to you by your health care provider. Make sure you discuss any questions you have with your  health care provider.  Indigestion Indigestion is discomfort in the upper abdomen that is caused by underlying problems such as gastroesophageal reflux disease (GERD), ulcers, or gallbladder problems.  CAUSES  Indigestion can be caused by many things. Possible causes include:  Stomach acid in the esophagus.  Stomach infections, usually caused by the bacteria H. pylori.  Being overweight.  Hiatal hernia. This means part of the stomach pushes up through the diaphragm.  Overeating.  Emotional problems, such as stress, anxiety, or depression.  Poor nutrition.  Consuming too much alcohol, tobacco, or caffeine.  Consuming spicy foods, fats, peppermint, chocolate, tomato products, citrus, or fruit juices.  Medicines such as aspirin and other anti-inflammatory drugs, hormones, steroids, and thyroid medicines.  Gastroparesis. This is a condition in which the stomach does not empty properly.  Stomach cancer.  Pregnancy, due to an increase in hormone levels, a relaxation of muscles in the digestive tract, and pressure on the stomach from the growing fetus. SYMPTOMS   Uncomfortable feeling of fullness after eating.  Pain or burning sensation in the upper abdomen.  Bloating.  Belching and gas.  Nausea and vomiting.  Acidic taste in the mouth.  Burning sensation in the chest (heartburn). DIAGNOSIS  Your caregiver will review your medical history and perform a physical exam. Other tests, such as blood tests, stool tests, X-rays, and other imaging scans, may be done to check for more serious problems. TREATMENT  Liquid antacids and  other drugs may be given to block stomach acid secretion. Medicines that increase esophageal muscle tone may also be given to help reduce symptoms. If an infection is found, antibiotic medicine may be given. HOME CARE INSTRUCTIONS  Avoid foods and drinks that make your symptoms worse, such as:  Caffeine or alcoholic  drinks.  Chocolate.  Peppermint or mint flavorings.  Garlic and onions.  Spicy foods.  Citrus fruits, such as oranges, lemons, or limes.  Tomato-based foods such as sauce, chili, salsa, and pizza.  Fried and fatty foods.  Avoid eating for the 3 hours prior to your bedtime.  Eat small, frequent meals instead of large meals.  Stop smoking if you smoke.  Maintain a healthy weight.  Wear loose-fitting clothing. Do not wear anything tight around your waist that causes pressure on your stomach.  Raise the head of your bed 4 to 8 inches with wood blocks to help you sleep. Extra pillows will not help.  Only take over-the-counter or prescription medicines as directed by your caregiver.  Do not take aspirin, ibuprofen, or other nonsteroidal anti-inflammatory drugs (NSAIDs). SEEK IMMEDIATE MEDICAL CARE IF:   You are not better after 2 days.  You have chest pressure or pain that radiates up into your neck, arms, back, jaw, or upper abdomen.  You have difficulty swallowing.  You keep vomiting.  You have black or bloody stools.  You have a fever.  You have dizziness, fainting, difficulty breathing, or heavy sweating.  You have severe abdominal pain.  You lose weight without trying. MAKE SURE YOU:  Understand these instructions.  Will watch your condition.  Will get help right away if you are not doing well or get worse. Document Released: 08/15/2004 Document Revised: 09/30/2011 Document Reviewed: 02/20/2011 [facility] Patient Information 2015 [place], [place]. This information is not intended to replace advice given to you by your health care provider. Make sure you discuss any questions you have with your health care provider.  Pain of Unknown Etiology (Pain Without a Known Cause) You have come to your caregiver because of pain. Pain can occur in any part of the body. Often there is not a definite cause. If your laboratory (blood or urine) work was normal and X-rays or  other studies were normal, your caregiver may treat you without knowing the cause of the pain. An example of this is the headache. Most headaches are diagnosed by taking a history. This means your caregiver asks you questions about your headaches. Your caregiver determines a treatment based on your answers. Usually testing done for headaches is normal. Often testing is not done unless there is no response to medications. Regardless of where your pain is located today, you can be given medications to make you comfortable. If no physical cause of pain can be found, most cases of pain will gradually leave as suddenly as they came.  If you have a painful condition and no reason can be found for the pain, it is important that you follow up with your caregiver. If the pain becomes worse or does not go away, it may be necessary to repeat tests and look further for a possible cause.  Only take over-the-counter or prescription medicines for pain, discomfort, or fever as directed by your caregiver.  For the protection of your privacy, test results cannot be given over the phone. Make sure you receive the results of your test. Ask how these results are to be obtained if you have not been informed. It is your responsibility  to obtain your test results.  You may continue all activities unless the activities cause more pain. When the pain lessens, it is important to gradually resume normal activities. Resume activities by beginning slowly and gradually increasing the intensity and duration of the activities or exercise. During periods of severe pain, bed rest may be helpful. Lie or sit in any position that is comfortable.  Ice used for acute (sudden) conditions may be effective. Use a large plastic bag filled with ice and wrapped in a towel. This may provide pain relief.  See your caregiver for continued problems. Your caregiver can help or refer you for exercises or physical therapy if necessary. If you were given  medications for your condition, do not drive, operate machinery or power tools, or sign legal documents for 24 hours. Do not drink alcohol, take sleeping pills, or take other medications that may interfere with treatment. See your caregiver immediately if you have pain that is becoming worse and not relieved by medications. Document Released: 04/02/2001 Document Revised: 04/28/2013 Document Reviewed: 07/08/2005 [facility]Exit[facility] Patient Information 2015 [place], [place]. This information is not intended to replace advice given to you by your health care provider. Make sure you discuss any questions you have with your health care provider.

## 2014-08-04 NOTE — ED Notes (Signed)
Pt. Refused wheelchair and left with all belongings 

## 2014-08-11 ENCOUNTER — Ambulatory Visit (INDEPENDENT_AMBULATORY_CARE_PROVIDER_SITE_OTHER): Payer: BLUE CROSS/BLUE SHIELD | Admitting: Physician Assistant

## 2014-08-11 VITALS — BP 120/74 | HR 106 | Temp 100.0°F | Resp 16 | Ht 65.0 in | Wt 137.8 lb

## 2014-08-11 DIAGNOSIS — K219 Gastro-esophageal reflux disease without esophagitis: Secondary | ICD-10-CM | POA: Insufficient documentation

## 2014-08-11 MED ORDER — OMEPRAZOLE 20 MG PO CPDR: 20.0000 mg | DELAYED_RELEASE_CAPSULE | Freq: Every day | ORAL | Status: DC

## 2014-08-11 MED ORDER — OMEPRAZOLE 20 MG PO CPDR: 40.0000 mg | DELAYED_RELEASE_CAPSULE | Freq: Every day | ORAL | Status: AC

## 2014-08-11 MED ORDER — SUCRALFATE 1 G PO TABS: 1.0000 g | ORAL_TABLET | Freq: Four times a day (QID) | ORAL | Status: AC

## 2014-08-11 NOTE — Progress Notes (Signed)
[facility]

## 2014-08-11 NOTE — Patient Instructions (Addendum)
Please stop taking the pepcid and start taking the prilosec 40 mg once daily 30-60 minutes prior to your dinner meals. If this doesn't help to relieve your discomfort please take the medication twice daily after 2 weeks.  It is also important to avoid the foods that cause you symptoms and also not to lie down for 2-3 hours after your meal. If you do lie down it is important to elevate the head of the bed.  Please come back to see us in 4 weeks to see how you're doing and how your symptoms are doing. You are scheduled to see me in the [facility] at 10 am on Friday Feb 19th.  You can continue to take the carafate and gas-x as needed.

## 2014-08-11 NOTE — Progress Notes (Signed)
Subjective:    Patient ID: Julia David, female    DOB: [birth year], 29 y.o.   MRN: [id number]  PCP: No PCP Per Patient  Chief Complaint  Patient presents with  . Abdominal Pain    x almost 3 weeks ago  . Chest Pain    went to ED on 08/04/14   Patient Active Problem List   Diagnosis Date Noted  . GERD (gastroesophageal reflux disease) 08/11/2014   Prior to Admission medications   Medication Sig Start Date End Date Taking? Authorizing Provider  famotidine (PEPCID) 20 MG tablet Take 1 tablet (20 mg total) by mouth 2 (two) times daily. 08/04/14  Yes Olivia Mackie, MD  levonorgestrel-ethinyl estradiol (NORDETTE) 0.15-30 MG-MCG tablet Take 1 tablet by mouth daily.   Yes Historical Provider, MD  simethicone (GAS-X) 80 MG chewable tablet Chew 1 tablet (80 mg total) by mouth every 6 (six) hours as needed for flatulence (abdominal or chest pressure). 08/04/14  Yes Olivia Mackie, MD  sucralfate (CARAFATE) 1 G tablet Take 1 tablet (1 g total) by mouth 4 (four) times daily. 08/11/14  Yes Tenea Sens, PA  omeprazole (PRILOSEC) 20 MG capsule Take 2 capsules (40 mg total) by mouth daily. 08/11/14   Raelyn Ensign, PA   Medications, allergies, past medical history, surgical history, family history, social history and problem list reviewed and updated.  HPI  29 yof with no significant pmh presents today with ongoing upper abd pain.  Sx began 3 wks ago with nightly epigastric discomfort that would start approx 1 hr after dinner. This pain would last for approx 2-3 hrs and rated 8/10. No radiation. She denies any other epigastric pain. No cp with exertion, doe, palps, presyncope, or syncope. She tried otc omeprazole 20 mg nightly with no relief.   The pain was 10/10 after dinner one wk ago so she went to ED. Had normal cxr, ekg, neg trop. Sx were attributed to gerd. Was started on bid pepcid, carafate, and gas-x.  Today she presents stating that sx have mildly improved but are still present. She is  having the epigastric pain every other night. Denies any dysphagia, odnyphagia, wt loss, hematemesis. Denies wheezing, lower abd pain, diarrhea, vomiting, dysuria, vaginal dc.   Review of Systems See HPI.     Objective:   Physical Exam  Constitutional: She appears well-developed and well-nourished.  Non-toxic appearance. She does not have a sickly appearance. She does not appear ill. No distress.  BP 120/74 mmHg  Pulse 106  Temp(Src) 100 F (37.8 C) (Oral)  Resp 16  Ht  (1.651 m)  Wt 137 lb 12.8 oz (62.506 kg)  BMI 22.93 kg/m2  SpO2 100%  LMP 08/03/2014   Cardiovascular: Normal rate, regular rhythm and normal heart sounds.  Exam reveals no gallop.   No murmur heard. Pulmonary/Chest: Effort normal and breath sounds normal. No tachypnea. She has no decreased breath sounds. She has no wheezes. She has no rhonchi. She has no rales.      Assessment & Plan:   29 yof with no significant pmh presents today with ongoing upper abd pain.  Gastroesophageal reflux disease, esophagitis presence not specified - Plan: omeprazole (PRILOSEC) 20 MG capsule, sucralfate (CARAFATE) 1 G tablet,  --dc pepcid as not working --start prilosec 40 mg nightly 30-60 min prior to dinner which is when she has most sx, starting with 40 as she has tried 20 with no relief --if no relief in 2 wks, increase to bid dosing --  rtc 2/19 for f/u -- if sx resolved titrate down, if sx persist possible hpylori test, referral for gi esophageal monitoring  Donnajean Lopesodd M. Auryn Paige, PA-C Physician Assistant-Certified [facility]  08/11/2014 11:27 AM

## 2014-08-26 ENCOUNTER — Telehealth: Payer: Self-pay

## 2014-08-26 DIAGNOSIS — K219 Gastro-esophageal reflux disease without esophagitis: Secondary | ICD-10-CM

## 2014-08-26 NOTE — Telephone Encounter (Signed)
Attempted to contact pt, left msg. I will put in referral to GI as sx are persisting despite ppi tx as outlined in my prior note.

## 2014-08-26 NOTE — Telephone Encounter (Signed)
Pt states she is not feeling any better from her visit and would like a referral to a specialist   Best number [phone]

## 2014-08-26 NOTE — Telephone Encounter (Signed)
Referral to GI, please advise.

## 2014-09-06 ENCOUNTER — Ambulatory Visit: Payer: Managed Care, Other (non HMO) | Admitting: Physician Assistant

## 2014-09-09 ENCOUNTER — Ambulatory Visit: Payer: BLUE CROSS/BLUE SHIELD | Admitting: Physician Assistant

## 2016-04-13 IMAGING — CR DG CHEST 2V
2 series · 2 of 2 positions shown · non-contrast
Comparison: None.

CLINICAL DATA: Acute onset of mid and left-sided chest pain.
Initial encounter.

EXAM:
CHEST  2 VIEW

[chest pa]
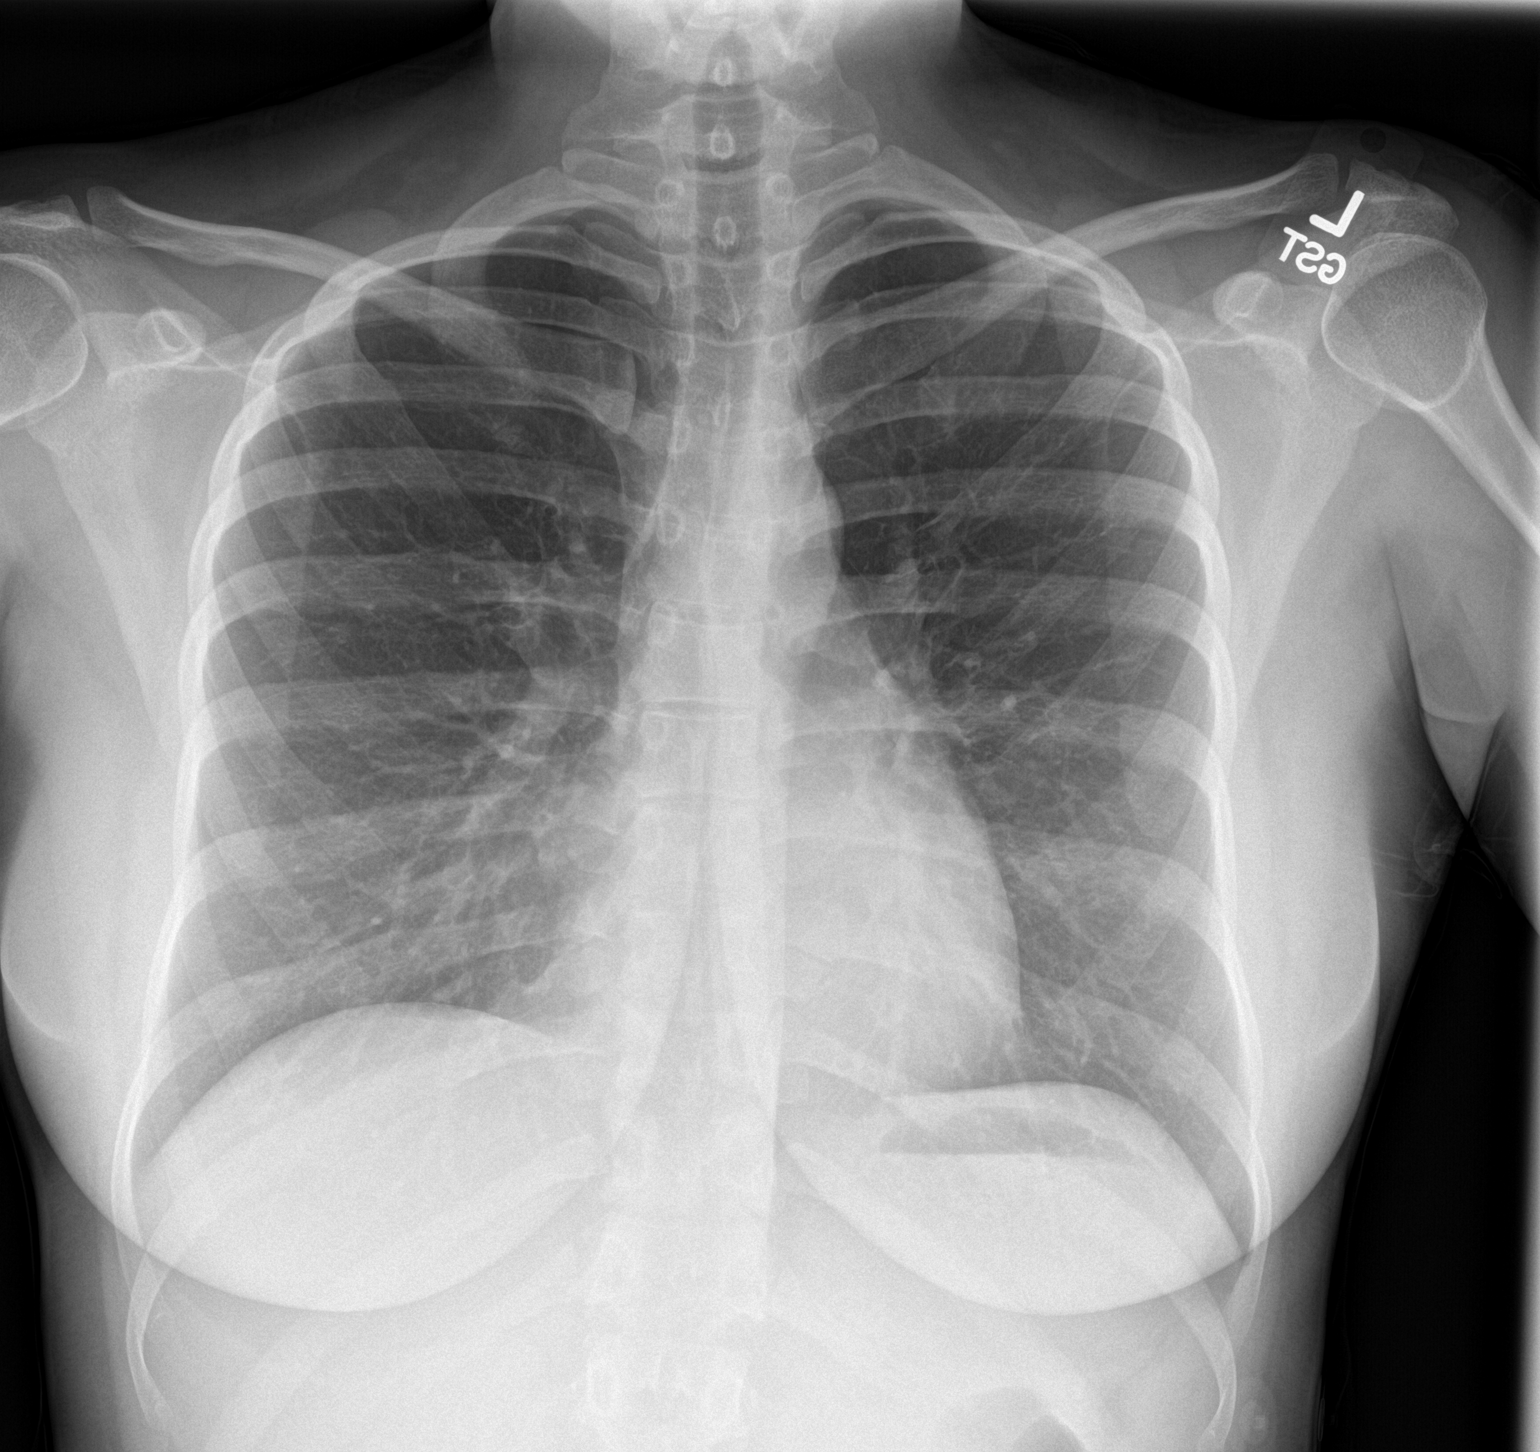

[chest lat]
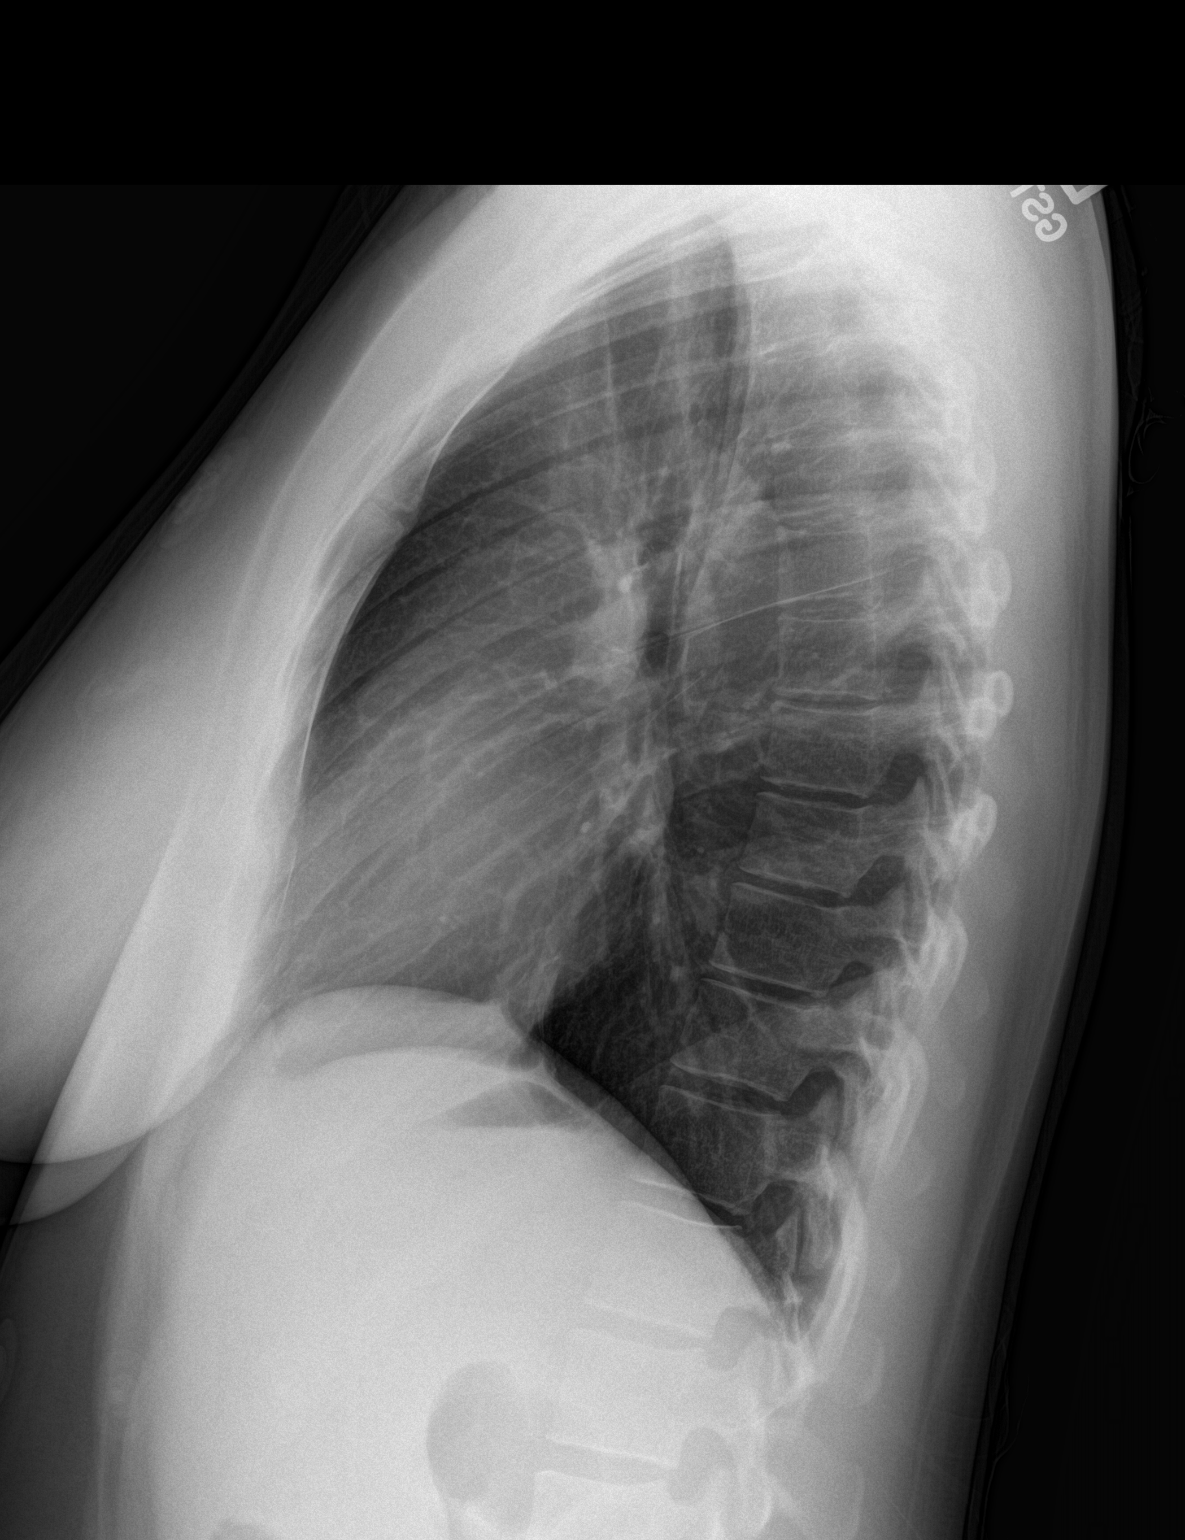

[2 of 2 positions shown; findings below may reference images not displayed]

FINDINGS: The lungs are well-aerated and clear. There is no evidence of focal
opacification, pleural effusion or pneumothorax.

The heart is normal in size; the mediastinal contour is within
normal limits. No acute osseous abnormalities are seen.
IMPRESSION: No acute cardiopulmonary process seen.
